# Patient Record
Sex: Male | Born: 1975 | Race: Black or African American | Hispanic: No | Smoking: Never smoker
Health system: Southern US, Community
[De-identification: ages and names within clinical notes are randomized; demographics above are authoritative.]

## PROBLEM LIST (undated history)

## (undated) DIAGNOSIS — F419 Anxiety disorder, unspecified: Secondary | ICD-10-CM

## (undated) DIAGNOSIS — S069XAA Unspecified intracranial injury with loss of consciousness status unknown, initial encounter: Secondary | ICD-10-CM

## (undated) DIAGNOSIS — F32A Depression, unspecified: Secondary | ICD-10-CM

## (undated) DIAGNOSIS — E119 Type 2 diabetes mellitus without complications: Secondary | ICD-10-CM

## (undated) HISTORY — DX: Unspecified intracranial injury with loss of consciousness status unknown, initial encounter: S06.9XAA

## (undated) HISTORY — PX: SHOULDER ARTHROSCOPY: SHX128

## (undated) HISTORY — DX: Depression, unspecified: F32.A

## (undated) HISTORY — DX: Anxiety disorder, unspecified: F41.9

## (undated) HISTORY — PX: APPENDECTOMY: SHX54

## (undated) HISTORY — DX: Type 2 diabetes mellitus without complications: E11.9

## (undated) HISTORY — PX: MENISCUS REPAIR: SHX5179

---

## 2015-12-03 DIAGNOSIS — S069XAA Unspecified intracranial injury with loss of consciousness status unknown, initial encounter: Secondary | ICD-10-CM | POA: Insufficient documentation

## 2016-08-15 DIAGNOSIS — F4321 Adjustment disorder with depressed mood: Secondary | ICD-10-CM | POA: Insufficient documentation

## 2018-05-17 DIAGNOSIS — R739 Hyperglycemia, unspecified: Secondary | ICD-10-CM | POA: Insufficient documentation

## 2019-08-20 DIAGNOSIS — F339 Major depressive disorder, recurrent, unspecified: Secondary | ICD-10-CM | POA: Insufficient documentation

## 2020-06-05 DIAGNOSIS — G8929 Other chronic pain: Secondary | ICD-10-CM | POA: Insufficient documentation

## 2021-05-28 ENCOUNTER — Encounter (HOSPITAL_BASED_OUTPATIENT_CLINIC_OR_DEPARTMENT_OTHER): Payer: Self-pay | Admitting: Emergency Medicine

## 2021-05-28 ENCOUNTER — Emergency Department (HOSPITAL_BASED_OUTPATIENT_CLINIC_OR_DEPARTMENT_OTHER)
Admission: EM | Admit: 2021-05-28 | Discharge: 2021-05-28 | Disposition: A | Discharge status: Home / Self Care | Payer: Self-pay | Attending: Emergency Medicine | Admitting: Emergency Medicine

## 2021-05-28 ENCOUNTER — Other Ambulatory Visit: Payer: Self-pay

## 2021-05-28 ENCOUNTER — Emergency Department (HOSPITAL_BASED_OUTPATIENT_CLINIC_OR_DEPARTMENT_OTHER): Payer: Self-pay

## 2021-05-28 DIAGNOSIS — M545 Low back pain, unspecified: Secondary | ICD-10-CM | POA: Insufficient documentation

## 2021-05-28 DIAGNOSIS — S43402A Unspecified sprain of left shoulder joint, initial encounter: Secondary | ICD-10-CM

## 2021-05-28 DIAGNOSIS — S39012A Strain of muscle, fascia and tendon of lower back, initial encounter: Secondary | ICD-10-CM

## 2021-05-28 DIAGNOSIS — R030 Elevated blood-pressure reading, without diagnosis of hypertension: Secondary | ICD-10-CM

## 2021-05-28 MED ORDER — IBUPROFEN 400 MG PO TABS
400.0000 mg | ORAL_TABLET | Freq: Once | ORAL | Status: AC
  Administered 2021-05-28: 400 mg via ORAL
  Filled 2021-05-28: qty 1

## 2021-05-28 MED ORDER — METHOCARBAMOL 750 MG PO TABS
750.0000 mg | ORAL_TABLET | Freq: Three times a day (TID) | ORAL | 0 refills | Status: DC | PRN
Start: 2021-05-28 — End: 2021-11-08

## 2021-05-28 NOTE — ED Notes (Signed)
ED Provider at bedside. 
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
## 2021-05-28 NOTE — Discharge Instructions (Addendum)
It was our pleasure to provide your ER care today - we hope that you feel better. ? ?Take acetaminophen or ibuprofen as need. You may also take robaxin as need for muscle pain/spasm - no driving when taking. ? ?Follow up with orthopedist in two weeks if symptoms fail to improve/resolve - call office to arrange appointment.  Your xrays were read as showing: Discontinuity of the left acromion. Os acromiale (normal variant) favored over recent acromion fracture, but query point tenderness - discuss with doctor at follow up.  ? ?Your blood pressure is high today - follow up with primary care doctor for recheck in 1-2 weeks.  ? ?Return to ER if worse, new symptoms, new/severe pain, or other concern.  ?

## 2021-05-28 NOTE — ED Notes (Signed)
Discharge instructions discussed with pt. Pt verbalized understanding. Pt stable and ambulatory.  °

## 2021-05-28 NOTE — ED Notes (Signed)
Pt left before vital could be reevaluated. Pt upset that CT was not done on shoulder ?

## 2021-05-28 NOTE — ED Notes (Signed)
Patient transported to Radiology 
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
## 2021-07-19 LAB — HEPATIC FUNCTION PANEL
ALT: 25 U/L (ref 10–40)
AST: 19 (ref 14–40)
Alkaline Phosphatase: 83 (ref 25–125)
Bilirubin, Total: 0.8

## 2021-07-19 LAB — VITAMIN D 25 HYDROXY (VIT D DEFICIENCY, FRACTURES): Vit D, 25-Hydroxy: 9.03

## 2021-07-19 LAB — PTH, INTACT: PTH, Intact: 66.6

## 2021-07-19 LAB — BASIC METABOLIC PANEL
BUN: 19 (ref 4–21)
CO2: 24 — AB (ref 13–22)
Chloride: 104 (ref 99–108)
Creatinine: 1.1 (ref 0.6–1.3)
Glucose: 208
Potassium: 4.5 mEq/L (ref 3.5–5.1)
Sodium: 140 (ref 137–147)

## 2021-07-19 LAB — CBC AND DIFFERENTIAL
HCT: 44 (ref 41–53)
Hemoglobin: 14.5 (ref 13.5–17.5)
Platelets: 151 10*3/uL (ref 150–400)
WBC: 6.7

## 2021-07-19 LAB — CBC: RBC: 4.9 (ref 3.87–5.11)

## 2021-07-19 LAB — LIPID PANEL
Cholesterol: 200 (ref 0–200)
HDL: 48 (ref 35–70)
LDL Cholesterol: 125
Triglycerides: 137 (ref 40–160)

## 2021-07-19 LAB — COMPREHENSIVE METABOLIC PANEL
Albumin: 4.3 (ref 3.5–5.0)
Calcium: 9.3 (ref 8.7–10.7)
Globulin: 2.3

## 2021-07-19 LAB — HM HIV SCREENING LAB: HM HIV Screening: NEGATIVE

## 2021-07-19 LAB — HEMOGLOBIN A1C: Hemoglobin A1C: 7.6

## 2021-11-08 ENCOUNTER — Ambulatory Visit (INDEPENDENT_AMBULATORY_CARE_PROVIDER_SITE_OTHER): Payer: Self-pay | Admitting: Nurse Practitioner

## 2021-11-08 ENCOUNTER — Encounter: Payer: Self-pay | Admitting: Nurse Practitioner

## 2021-11-08 VITALS — BP 130/92 | HR 96 | Temp 97.6°F | Resp 12 | Ht 74.0 in | Wt 220.1 lb

## 2021-11-08 DIAGNOSIS — M25562 Pain in left knee: Secondary | ICD-10-CM

## 2021-11-08 DIAGNOSIS — R030 Elevated blood-pressure reading, without diagnosis of hypertension: Secondary | ICD-10-CM

## 2021-11-08 DIAGNOSIS — E1169 Type 2 diabetes mellitus with other specified complication: Secondary | ICD-10-CM

## 2021-11-08 DIAGNOSIS — E785 Hyperlipidemia, unspecified: Secondary | ICD-10-CM

## 2021-11-08 DIAGNOSIS — G8929 Other chronic pain: Secondary | ICD-10-CM

## 2021-11-08 DIAGNOSIS — S069X9S Unspecified intracranial injury with loss of consciousness of unspecified duration, sequela: Secondary | ICD-10-CM

## 2021-11-08 DIAGNOSIS — E1165 Type 2 diabetes mellitus with hyperglycemia: Secondary | ICD-10-CM

## 2021-11-08 DIAGNOSIS — N50811 Right testicular pain: Secondary | ICD-10-CM

## 2021-11-08 DIAGNOSIS — N50812 Left testicular pain: Secondary | ICD-10-CM

## 2021-11-08 LAB — COMPREHENSIVE METABOLIC PANEL
ALT: 24 U/L (ref 0–53)
AST: 18 U/L (ref 0–37)
Albumin: 4.3 g/dL (ref 3.5–5.2)
Alkaline Phosphatase: 63 U/L (ref 39–117)
BUN: 12 mg/dL (ref 6–23)
CO2: 27 mEq/L (ref 19–32)
Calcium: 9.2 mg/dL (ref 8.4–10.5)
Chloride: 105 mEq/L (ref 96–112)
Creatinine, Ser: 1 mg/dL (ref 0.40–1.50)
GFR: 91.74 mL/min (ref >60.00)
Glucose, Bld: 174 mg/dL — ABNORMAL HIGH (ref 70–99)
Potassium: 3.9 mEq/L (ref 3.5–5.1)
Sodium: 139 mEq/L (ref 135–145)
Total Bilirubin: 0.9 mg/dL (ref 0.2–1.2)
Total Protein: 6.7 g/dL (ref 6.0–8.3)

## 2021-11-08 LAB — CBC
HCT: 41.7 % (ref 39.0–52.0)
Hemoglobin: 13.9 g/dL (ref 13.0–17.0)
MCHC: 33.2 g/dL (ref 30.0–36.0)
MCV: 91 fl (ref 78.0–100.0)
Platelets: 179 10*3/uL (ref 150.0–400.0)
RBC: 4.58 Mil/uL (ref 4.22–5.81)
RDW: 13.7 % (ref 11.5–15.5)
WBC: 5.4 10*3/uL (ref 4.0–10.5)

## 2021-11-08 LAB — MICROALBUMIN / CREATININE URINE RATIO
Creatinine,U: 71.9 mg/dL
Microalb Creat Ratio: 1.6 mg/g (ref 0.0–30.0)
Microalb, Ur: 1.2 mg/dL (ref 0.0–1.9)

## 2021-11-08 LAB — LIPID PANEL
Cholesterol: 184 mg/dL (ref 0–200)
HDL: 44.3 mg/dL (ref >39.00)
LDL Cholesterol: 128 mg/dL — ABNORMAL HIGH (ref 0–99)
NonHDL: 139.5
Total CHOL/HDL Ratio: 4
Triglycerides: 59 mg/dL (ref 0.0–149.0)
VLDL: 11.8 mg/dL (ref 0.0–40.0)

## 2021-11-08 LAB — HEMOGLOBIN A1C: Hgb A1c MFr Bld: 7.3 % — ABNORMAL HIGH (ref 4.6–6.5)

## 2021-11-08 NOTE — Assessment & Plan Note (Signed)
Patient's last A1c was 7.6 in office.  Patient currently on metformin and Mounjaro 2.5 mg.  Tolerating medications well per patient report does check his sugar several times a day up to 3-4 times.
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
## 2021-11-08 NOTE — Assessment & Plan Note (Signed)
Last LDL was 125 pending being scanned to the chart.  Patient is fasting this morning we will check cholesterol given setting of diabetes.  Patient is not  obese

## 2021-11-08 NOTE — Patient Instructions (Addendum)
Nice to see you today I will be in touch with the labs once I have them I want to see you in 4 month for a diabetes follow up, sooner if you need me    Let me know about if you want to get the ultrasound done

## 2021-11-08 NOTE — Assessment & Plan Note (Signed)
Elevated in office slightly still elevated slightly on recheck.  If continue to be elevated consider placing patient on ARB due to diabetes diagnosis
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
## 2021-11-08 NOTE — Assessment & Plan Note (Signed)
Has been going on for approximately 6 months or more.  Intermittent in nature exam grossly normal in office today.  Did tell patient to contact ultrasound at this persists.  Patient will think on this and get back to me whether he like to pursue ultrasound of testicles

## 2021-11-08 NOTE — Assessment & Plan Note (Signed)
Has chronic knee pain along with shoulder pain with repair patient needs a rotator cuff repair currently.  He is [facility] receiving Percocet 12/29/2023 6 times a day as needed.  Will refer to pain management

## 2021-11-11 ENCOUNTER — Telehealth: Payer: Self-pay | Admitting: Nurse Practitioner

## 2021-11-11 DIAGNOSIS — E1165 Type 2 diabetes mellitus with hyperglycemia: Secondary | ICD-10-CM

## 2021-11-11 MED ORDER — ROSUVASTATIN CALCIUM 5 MG PO TABS: 5.0000 mg | ORAL_TABLET | Freq: Every day | ORAL | 3 refills | Status: DC

## 2021-11-11 NOTE — Telephone Encounter (Signed)
-----   Message from [facility], [place] sent at 11/10/2021  2:36 PM EDT ----- Patient advised. Patient agreeable to taking medication. Fasting lab appt set up already for 02/08/22

## 2021-11-11 NOTE — Telephone Encounter (Signed)
Medication sent in. 

## 2021-11-15 ENCOUNTER — Encounter: Payer: Self-pay | Admitting: *Deleted

## 2021-11-22 ENCOUNTER — Encounter: Payer: Self-pay | Admitting: Nurse Practitioner

## 2021-12-07 ENCOUNTER — Telehealth: Payer: Self-pay | Admitting: Nurse Practitioner

## 2021-12-07 NOTE — Telephone Encounter (Signed)
I sent patient mychart to follow up on this, patient was given information on the pain management insurance x 2 via mychart and has viewed this. Waiting for response

## 2021-12-07 NOTE — Telephone Encounter (Signed)
They reached out to me about his pain clinic referral can we see where we are and what is happening in that regard and reach out to the patient please  Can we see if they have called the [facility] please

## 2021-12-07 NOTE — Telephone Encounter (Signed)
Spoke with [facility], they did receive referral and it is in review by the provider at this time. Patient advised via mychart message

## 2021-12-13 ENCOUNTER — Telehealth: Payer: Self-pay

## 2021-12-13 DIAGNOSIS — G8929 Other chronic pain: Secondary | ICD-10-CM

## 2021-12-13 NOTE — Telephone Encounter (Signed)
Heard from [place], patient's girlfriend, [facility] office does not see patients for opioids treatment but they found another office and spoke with them and were told they could see the patient is we send over the referral. Dr Rayvon Char. [place] in [place] Fax: [phone].  Referral placed and pended for review/signature. Lovena Le is aware Catalina Antigua is not in the office but since they have been dealing with this for a while will have another provider review.

## 2021-12-14 NOTE — Telephone Encounter (Signed)
Completed.

## 2021-12-14 NOTE — Telephone Encounter (Signed)
Shane Wilcox advised on voicemail that referral has been sent over

## 2021-12-17 ENCOUNTER — Telehealth: Payer: Self-pay | Admitting: Nurse Practitioner

## 2021-12-17 DIAGNOSIS — G8929 Other chronic pain: Secondary | ICD-10-CM

## 2021-12-17 NOTE — Telephone Encounter (Signed)
Patient spouse Shane Wilcox called in and was wanting a pain management referral to be sent over to [facility]. Fax number is [phone]. She stated she will like a call when referral is put in. Thank you!

## 2021-12-20 ENCOUNTER — Other Ambulatory Visit: Payer: Self-pay | Admitting: Nurse Practitioner

## 2021-12-20 MED ORDER — OXYCODONE-ACETAMINOPHEN 10-325 MG PO TABS: 1.0000 | ORAL_TABLET | Freq: Three times a day (TID) | ORAL | 0 refills | Status: DC | PRN

## 2021-12-20 NOTE — Telephone Encounter (Signed)
Please review. Patient is out of the medication for 4 days now. Patient is still trying to get in with pain management. [facility] said they do not do opiod management [facility] said they only manage Percocet at 4 tablets daily max and he is on 6.  Can they get refills to last him until he can get in with someone. They are trying still to get in.  New referral placed for [facility] today. Waiting for that to be reviewed.

## 2021-12-20 NOTE — Telephone Encounter (Signed)
Patient advised.

## 2021-12-20 NOTE — Telephone Encounter (Signed)
Patient is ok with 10-325 mg 3 tablets a day

## 2021-12-20 NOTE — Telephone Encounter (Signed)
  Encourage patient to contact the pharmacy for refills or they can request refills through [facility]  Did the patient contact the pharmacy: no    LAST APPOINTMENT DATE:  Please schedule appointment if longer than 1 year  NEXT APPOINTMENT DATE:02/08/2022  MEDICATION:oxyCODONE-acetaminophen (PERCOCET) 10-325 MG tablet  Is the patient out of medication? yes  If not, how much is left?  Is this a 90 day supply: no  PHARMACY: [facility] [address] - [place], [place] - [address] [place] [address] [place] Phone:  [phone]  Fax:  [phone]      Let patient know to contact pharmacy at the end of the day to make sure medication is ready.  Please notify patient to allow 48-72 hours to process  CLINICAL FILLS OUT ALL BELOW:

## 2021-12-20 NOTE — Telephone Encounter (Signed)
Referral placed.

## 2021-12-27 ENCOUNTER — Other Ambulatory Visit: Payer: Self-pay | Admitting: Nurse Practitioner

## 2021-12-27 MED ORDER — OXYCODONE-ACETAMINOPHEN 10-325 MG PO TABS: 1.0000 | ORAL_TABLET | Freq: Three times a day (TID) | ORAL | 0 refills | Status: AC | PRN

## 2021-12-27 NOTE — Telephone Encounter (Signed)
  Encourage patient to contact the pharmacy for refills or they can request refills through [facility]  Did the patient contact the pharmacy: No  LAST APPOINTMENT DATE: 11/08/2021  NEXT APPOINTMENT DATE:  N/A  MEDICATION: oxyCODONE-acetaminophen (PERCOCET) 10-325 MG tablet  Is the patient out of medication? Yes  PHARMACY: [facility] [address] - [place], [place] - [address] [place] [address]  Comment:  Patient still hasn't received no call from referral.   Let patient know to contact pharmacy at the end of the day to make sure medication is ready.  Please notify patient to allow 48-72 hours to process

## 2022-02-08 ENCOUNTER — Ambulatory Visit: Payer: Medicaid Other | Admitting: Nurse Practitioner

## 2022-02-08 ENCOUNTER — Other Ambulatory Visit: Payer: Self-pay

## 2022-05-18 ENCOUNTER — Encounter: Payer: Self-pay | Admitting: Nurse Practitioner

## 2022-05-18 ENCOUNTER — Ambulatory Visit: Payer: Medicaid Other | Admitting: Nurse Practitioner

## 2022-09-26 ENCOUNTER — Ambulatory Visit: Payer: Medicaid Other | Admitting: Nurse Practitioner

## 2022-10-27 ENCOUNTER — Ambulatory Visit: Payer: Medicaid Other | Admitting: Nurse Practitioner

## 2022-11-16 ENCOUNTER — Ambulatory Visit (INDEPENDENT_AMBULATORY_CARE_PROVIDER_SITE_OTHER): Payer: Medicaid Other | Admitting: Nurse Practitioner

## 2022-11-16 ENCOUNTER — Encounter: Payer: Self-pay | Admitting: *Deleted

## 2022-11-16 ENCOUNTER — Encounter: Payer: Self-pay | Admitting: Nurse Practitioner

## 2022-11-16 VITALS — BP 128/88 | HR 102 | Temp 98.1°F | Ht 74.0 in | Wt 212.0 lb

## 2022-11-16 DIAGNOSIS — E785 Hyperlipidemia, unspecified: Secondary | ICD-10-CM

## 2022-11-16 DIAGNOSIS — E1165 Type 2 diabetes mellitus with hyperglycemia: Secondary | ICD-10-CM

## 2022-11-16 DIAGNOSIS — M25562 Pain in left knee: Secondary | ICD-10-CM

## 2022-11-16 DIAGNOSIS — Z7985 Long-term (current) use of injectable non-insulin antidiabetic drugs: Secondary | ICD-10-CM

## 2022-11-16 DIAGNOSIS — N529 Male erectile dysfunction, unspecified: Secondary | ICD-10-CM | POA: Diagnosis not present

## 2022-11-16 DIAGNOSIS — E1169 Type 2 diabetes mellitus with other specified complication: Secondary | ICD-10-CM

## 2022-11-16 DIAGNOSIS — G8929 Other chronic pain: Secondary | ICD-10-CM

## 2022-11-16 LAB — COMPREHENSIVE METABOLIC PANEL
ALT: 17 U/L (ref 0–53)
AST: 17 U/L (ref 0–37)
Albumin: 4.4 g/dL (ref 3.5–5.2)
Alkaline Phosphatase: 79 U/L (ref 39–117)
BUN: 17 mg/dL (ref 6–23)
CO2: 26 meq/L (ref 19–32)
Calcium: 9.5 mg/dL (ref 8.4–10.5)
Chloride: 106 meq/L (ref 96–112)
Creatinine, Ser: 1.05 mg/dL (ref 0.40–1.50)
GFR: 85.91 mL/min (ref >60.00)
Glucose, Bld: 156 mg/dL — ABNORMAL HIGH (ref 70–99)
Potassium: 4.5 meq/L (ref 3.5–5.1)
Sodium: 139 meq/L (ref 135–145)
Total Bilirubin: 0.9 mg/dL (ref 0.2–1.2)
Total Protein: 6.7 g/dL (ref 6.0–8.3)

## 2022-11-16 LAB — POCT GLYCOSYLATED HEMOGLOBIN (HGB A1C): Hemoglobin A1C: 8.6 % — AB (ref 4.0–5.6)

## 2022-11-16 LAB — LIPID PANEL
Cholesterol: 194 mg/dL (ref 0–200)
HDL: 47.5 mg/dL (ref >39.00)
LDL Cholesterol: 129 mg/dL — ABNORMAL HIGH (ref 0–99)
NonHDL: 146.79
Total CHOL/HDL Ratio: 4
Triglycerides: 88 mg/dL (ref 0.0–149.0)
VLDL: 17.6 mg/dL (ref 0.0–40.0)

## 2022-11-16 LAB — MICROALBUMIN / CREATININE URINE RATIO
Creatinine,U: 155.5 mg/dL
Microalb Creat Ratio: 0.5 mg/g (ref 0.0–30.0)
Microalb, Ur: 0.9 mg/dL (ref 0.0–1.9)

## 2022-11-16 LAB — CBC
HCT: 42.5 % (ref 39.0–52.0)
Hemoglobin: 14 g/dL (ref 13.0–17.0)
MCHC: 33 g/dL (ref 30.0–36.0)
MCV: 90.5 fl (ref 78.0–100.0)
Platelets: 193 10*3/uL (ref 150.0–400.0)
RBC: 4.7 Mil/uL (ref 4.22–5.81)
RDW: 13.9 % (ref 11.5–15.5)
WBC: 6.6 10*3/uL (ref 4.0–10.5)

## 2022-11-16 MED ORDER — TADALAFIL 10 MG PO TABS
10.0000 mg | ORAL_TABLET | ORAL | 1 refills | Status: DC | PRN
Start: 2022-11-16 — End: 2022-12-19

## 2022-11-16 MED ORDER — JANUMET XR 50-1000 MG PO TB24
1.0000 | ORAL_TABLET | Freq: Every day | ORAL | 0 refills | Status: DC
Start: 2022-11-16 — End: 2023-02-16

## 2022-11-16 NOTE — Progress Notes (Signed)
Established Patient Office Visit  Subjective   Patient ID: Shane Wilcox, male    DOB: 05-01-1975  Age: 47 y.o. MRN: [id number]  Chief Complaint  Patient presents with   Referral    Pt complains of need for pain referral to [facility] pain. Pt states left hip to upper leg is causing severe pain.    Medication Reaction    Pt states he would like to take janumet 50mg  OTC for diabetes instead of metformin    HPI 8.6 DM2: Patient was supposed to be on metformin and Mounjaro.  Have not seen patient for approximately 1 year. States that he tried the janumet from his mother. 50/1000. Checking sugars: states that he is checking it twice a day. 124 this morning 158 last night  Hyperglycemia: Hypoglycemia: None States that the plain metformin cause stomach upset and diarrhea. He was taking the mounjaro and the januet from his mother   HLD: Patient was on Crestor 5 mg for HLD and risk reduction in setting of diabetes.  Patient did not have follow-up labs after starting medication done. Has not been taking  HTN: states that he is checking it 2-4 times a month and has been normal    Chronic pain: patient was seen at [facility] in the past and was referred out he is currenty be ing seen by Tonye Royalty, MD in [place]  States that he is having trouble getting an erection. States that he can get them sometimes and he is able to [facility] and have sex. Has tried vigara in the past. States he has bad heart burn with the medication    Review of Systems  Constitutional:  Negative for chills and fever.  Respiratory:  Negative for shortness of breath.   Cardiovascular:  Negative for chest pain.  Gastrointestinal:  Negative for abdominal pain, constipation, diarrhea, nausea and vomiting.  Neurological:  Negative for headaches.  Psychiatric/Behavioral:  Negative for hallucinations and suicidal ideas.       Objective:     BP 128/88   Pulse (!) 102   Temp 98.1 F (36.7 C)  (Temporal)   Ht 6\' 2"  (1.88 m)   Wt 212 lb (96.2 kg)   SpO2 94%   BMI 27.22 kg/m  BP Readings from Last 3 Encounters:  11/16/22 128/88  11/08/21 (!) 130/92  05/28/21 (!) 145/107   Wt Readings from Last 3 Encounters:  11/16/22 212 lb (96.2 kg)  11/08/21 220 lb 2 oz (99.8 kg)  05/28/21 215 lb (97.5 kg)   SpO2 Readings from Last 3 Encounters:  11/16/22 94%  11/08/21 97%  05/28/21 96%      Physical Exam Vitals and nursing note reviewed.  Constitutional:      Appearance: Normal appearance.  Cardiovascular:     Rate and Rhythm: Normal rate and regular rhythm.     Pulses:          Dorsalis pedis pulses are 2+ on the right side and 2+ on the left side.       Posterior tibial pulses are 2+ on the right side and 2+ on the left side.     Heart sounds: Normal heart sounds.  Pulmonary:     Effort: Pulmonary effort is normal.     Breath sounds: Normal breath sounds.  Musculoskeletal:     Right lower leg: No edema.     Left lower leg: No edema.  Feet:     Right foot:     Skin integrity: Dry skin present.  Left foot:     Skin integrity: Dry skin present.  Neurological:     Mental Status: He is alert.      Results for orders placed or performed in visit on 11/16/22  POCT glycosylated hemoglobin (Hb A1C)  Result Value Ref Range   Hemoglobin A1C 8.6 (A) 4.0 - 5.6 %   HbA1c POC (<> result, manual entry)     HbA1c, POC (prediabetic range)     HbA1c, POC (controlled diabetic range)        The 10-year ASCVD risk score (Arnett DK, et al., 2019) is: 7.8%    Assessment & Plan:   Problem List Items Addressed This Visit       Endocrine   Hyperlipidemia associated with type 2 diabetes mellitus (HCC)    Has not been taking the cholesterol medication.  Pending lipid panel today      Relevant Medications   lisinopril (ZESTRIL) 20 MG tablet   tadalafil (CIALIS) 10 MG tablet   SitaGLIPtin-MetFORMIN HCl (JANUMET XR) 50-1000 MG TB24   Other Relevant Orders   CBC    Comprehensive metabolic panel   Lipid panel   TSH   Type 2 diabetes mellitus with hyperglycemia, without long-term current use of insulin (HCC) - Primary    Patient is been taking Janumet and tirzepatide at the same time.  He is having GI upset with regular metformin.  Will start patient on Janumet 50-1000 mg XR once daily.  Patient to discontinue tirzepatide as he will be on a DPP 4.  Continue checking glucoses at home follow-up 3 months      Relevant Medications   lisinopril (ZESTRIL) 20 MG tablet   SitaGLIPtin-MetFORMIN HCl (JANUMET XR) 50-1000 MG TB24   Other Relevant Orders   POCT glycosylated hemoglobin (Hb A1C) (Completed)   CBC   Comprehensive metabolic panel   Lipid panel   TSH   Microalbumin / creatinine urine ratio     Other   Chronic pain of left knee    Patient is followed by pain clinic in [place].  Patient is been talking with his therapist and he felt like [facility] would be a better fit for him patient asked for referral referral placed today      Relevant Medications   oxyCODONE-acetaminophen (PERCOCET) 10-325 MG tablet   Other Relevant Orders   Ambulatory referral to Pain Clinic   Erectile dysfunction    Patient is trazodone for the past gave him incredible heartburn.  Will try tadalafil 10 mg as needed sexual intercourse      Relevant Medications   tadalafil (CIALIS) 10 MG tablet   Other Relevant Orders   TSH    Return in about 3 months (around 02/16/2023) for DM recheck.    Audria Nine, NP

## 2022-11-16 NOTE — Assessment & Plan Note (Signed)
Patient is been taking Janumet and tirzepatide at the same time.  He is having GI upset with regular metformin.  Will start patient on Janumet 50-1000 mg XR once daily.  Patient to discontinue tirzepatide as he will be on a DPP 4.  Continue checking glucoses at home follow-up 3 months

## 2022-11-16 NOTE — Assessment & Plan Note (Signed)
Has not been taking the cholesterol medication.  Pending lipid panel today

## 2022-11-16 NOTE — Patient Instructions (Signed)
Nice to see you today I have made the referral as requested I have sent in the Janumet. Take no other medications for your diabetes that is not prescribed to you Follow up with me in 3 months, sooner if you need me

## 2022-11-16 NOTE — Assessment & Plan Note (Signed)
Patient is followed by pain clinic in [place].  Patient is been talking with his therapist and he felt like [facility] would be a better fit for him patient asked for referral referral placed today

## 2022-11-16 NOTE — Assessment & Plan Note (Signed)
Patient is trazodone for the past gave him incredible heartburn.  Will try tadalafil 10 mg as needed sexual intercourse

## 2022-11-18 ENCOUNTER — Encounter: Payer: Self-pay | Admitting: Nurse Practitioner

## 2022-11-18 LAB — TSH: TSH: 1.73 u[IU]/mL (ref 0.35–5.50)

## 2022-11-21 ENCOUNTER — Encounter: Payer: Self-pay | Admitting: Nurse Practitioner

## 2022-11-21 ENCOUNTER — Telehealth: Payer: Self-pay | Admitting: Nurse Practitioner

## 2022-11-21 DIAGNOSIS — E1165 Type 2 diabetes mellitus with hyperglycemia: Secondary | ICD-10-CM

## 2022-11-21 MED ORDER — ROSUVASTATIN CALCIUM 5 MG PO TABS
5.0000 mg | ORAL_TABLET | Freq: Every day | ORAL | 0 refills | Status: DC
Start: 2022-11-21 — End: 2023-02-16

## 2022-11-21 NOTE — Telephone Encounter (Signed)
-----   Message from [facility] sent at 11/21/2022  1:03 PM EDT ----- Added from my chart message from patient.   Yes sir if you send it in I will take it if it helps me to prevent strokes or heart attacks

## 2022-11-22 NOTE — Telephone Encounter (Signed)
Please submit PA for Janumet XR

## 2022-11-24 ENCOUNTER — Telehealth: Payer: Self-pay

## 2022-11-24 NOTE — Telephone Encounter (Signed)
Pharmacy Patient Advocate Encounter   Received notification from Patient Advice Request messages that prior authorization for Janumet XR 50-1000MG  er tablets is required/requested.   Insurance verification completed.   The patient is insured through [facility] [place] .   Per test claim: PA required; PA submitted to [facility] Medicaid via CoverMyMeds Key/confirmation #/EOC [id number] Status is pending

## 2022-11-25 ENCOUNTER — Other Ambulatory Visit (HOSPITAL_COMMUNITY): Payer: Self-pay

## 2022-11-25 DIAGNOSIS — N529 Male erectile dysfunction, unspecified: Secondary | ICD-10-CM

## 2022-11-25 NOTE — Telephone Encounter (Signed)
Pharmacy Patient Advocate Encounter  Received notification from [facility] Medicaid that Prior Authorization for Janumet XR 50-1000MG  er tablets has been APPROVED from 11/24/22 to 11/24/23   PA #/Case ID/Reference #: [id number]

## 2022-12-16 NOTE — Telephone Encounter (Signed)
Message sent to patient to let know provider is out of the office and will address upon return.

## 2022-12-19 MED ORDER — TADALAFIL 10 MG PO TABS
10.0000 mg | ORAL_TABLET | ORAL | 1 refills | Status: DC | PRN
Start: 2022-12-19 — End: 2023-02-16

## 2022-12-19 NOTE — Addendum Note (Signed)
Addended by: Eden Emms on: 12/19/2022 01:46 PM   Modules accepted: Orders

## 2022-12-19 NOTE — Telephone Encounter (Signed)
Refill sent in

## 2023-02-16 ENCOUNTER — Ambulatory Visit: Payer: Medicaid Other | Admitting: Nurse Practitioner

## 2023-02-16 ENCOUNTER — Encounter: Payer: Self-pay | Admitting: Nurse Practitioner

## 2023-02-16 VITALS — BP 120/88 | HR 96 | Temp 97.9°F | Ht 74.0 in | Wt 208.0 lb

## 2023-02-16 DIAGNOSIS — Z7984 Long term (current) use of oral hypoglycemic drugs: Secondary | ICD-10-CM | POA: Diagnosis not present

## 2023-02-16 DIAGNOSIS — E785 Hyperlipidemia, unspecified: Secondary | ICD-10-CM

## 2023-02-16 DIAGNOSIS — N529 Male erectile dysfunction, unspecified: Secondary | ICD-10-CM | POA: Diagnosis not present

## 2023-02-16 DIAGNOSIS — E1169 Type 2 diabetes mellitus with other specified complication: Secondary | ICD-10-CM

## 2023-02-16 DIAGNOSIS — E1165 Type 2 diabetes mellitus with hyperglycemia: Secondary | ICD-10-CM

## 2023-02-16 LAB — POCT GLYCOSYLATED HEMOGLOBIN (HGB A1C): Hemoglobin A1C: 10.8 % — AB (ref 4.0–5.6)

## 2023-02-16 MED ORDER — TADALAFIL 10 MG PO TABS
10.0000 mg | ORAL_TABLET | ORAL | 1 refills | Status: AC | PRN
Start: 2023-02-16 — End: ?

## 2023-02-16 MED ORDER — LISINOPRIL 2.5 MG PO TABS: 2.5000 mg | ORAL_TABLET | Freq: Every day | ORAL | 2 refills | Status: AC

## 2023-02-16 MED ORDER — ROSUVASTATIN CALCIUM 5 MG PO TABS: 5.0000 mg | ORAL_TABLET | Freq: Every day | ORAL | 2 refills | Status: AC

## 2023-02-16 MED ORDER — JANUMET XR 50-1000 MG PO TB24: 2.0000 | ORAL_TABLET | Freq: Every day | ORAL | 1 refills | Status: AC

## 2023-02-16 MED ORDER — GLIPIZIDE ER 5 MG PO TB24: 5.0000 mg | ORAL_TABLET | Freq: Every day | ORAL | 1 refills | Status: AC

## 2023-02-16 NOTE — Patient Instructions (Signed)
Nice to see you today Follow up with me in 3 months, sooner if you need me

## 2023-02-16 NOTE — Progress Notes (Signed)
Established Patient Office Visit  Subjective   Patient ID: Shane Wilcox, male    DOB: March 10, 1976  Age: 47 y.o. MRN: [id number]  Chief Complaint  Patient presents with   Diabetes    Pt states that he has been doing good.    HPI  DM2: Patient currently maintained on Janumet 50-1000 XR once daily. 210 last night after dinner and 160 this morning. 2 tablets once a day.   HLD: Patient has a history of same and had been taking his cholesterol medication.  We did check lipids cholesterol was elevated patient was to start back on Crestor 5 mg.  HTN: Patient currently maintained on lisinopril 20 mg daily.  As of last report he was not taking. States that he was placed on this when he was incarcerated. Patient has not been on anti hypertensive's since.     Review of Systems  Constitutional:  Negative for chills and fever.  Respiratory:  Negative for shortness of breath.   Cardiovascular:  Negative for chest pain.  Neurological:  Negative for tingling and headaches.  Psychiatric/Behavioral:  Negative for hallucinations and suicidal ideas.       Objective:     BP 120/88   Pulse 96   Temp 97.9 F (36.6 C) (Oral)   Ht 6\' 2"  (1.88 m)   Wt 208 lb (94.3 kg)   SpO2 96%   BMI 26.71 kg/m    Physical Exam Vitals and nursing note reviewed.  Constitutional:      Appearance: Normal appearance.  Cardiovascular:     Rate and Rhythm: Normal rate and regular rhythm.     Heart sounds: Normal heart sounds.  Pulmonary:     Effort: Pulmonary effort is normal.     Breath sounds: Normal breath sounds.  Neurological:     Mental Status: He is alert.      Results for orders placed or performed in visit on 02/16/23  POCT glycosylated hemoglobin (Hb A1C)  Result Value Ref Range   Hemoglobin A1C 10.8 (A) 4.0 - 5.6 %   HbA1c POC (<> result, manual entry)     HbA1c, POC (prediabetic range)     HbA1c, POC (controlled diabetic range)        The 10-year ASCVD risk score (Arnett  DK, et al., 2019) is: 6.9%    Assessment & Plan:   Problem List Items Addressed This Visit       Endocrine   Hyperlipidemia associated with type 2 diabetes mellitus (HCC)    Patient currently maintained on rosuvastatin 5 mg daily.  Continue      Relevant Medications   SitaGLIPtin-MetFORMIN HCl (JANUMET XR) 50-1000 MG TB24   tadalafil (CIALIS) 10 MG tablet   lisinopril (ZESTRIL) 2.5 MG tablet   rosuvastatin (CRESTOR) 5 MG tablet   glipiZIDE (GLUCOTROL XL) 5 MG 24 hr tablet   Type 2 diabetes mellitus with hyperglycemia, without long-term current use of insulin (HCC) - Primary    Patient was maintained on Janumet 50-1000 mg 2 tablets daily.  Patient is A1c actually went up to 10.8% today we will add on glipizide 5 mg XL.  Was going out on a GLP-1 but he is on a DPP 4.  Continue checking glucose daily.  History of blood pressure problems was on lisinopril has not been on it for extended period of time we will put patient on lisinopril 2.5 mg daily for renal  protection in setting of diabetes      Relevant Medications  SitaGLIPtin-MetFORMIN HCl (JANUMET XR) 50-1000 MG TB24   lisinopril (ZESTRIL) 2.5 MG tablet   rosuvastatin (CRESTOR) 5 MG tablet   glipiZIDE (GLUCOTROL XL) 5 MG 24 hr tablet   Other Relevant Orders   POCT glycosylated hemoglobin (Hb A1C) (Completed)     Other   Erectile dysfunction    History of the same patient currently maintained on tadalafil refill provided      Relevant Medications   tadalafil (CIALIS) 10 MG tablet   Other Visit Diagnoses     Controlled type 2 diabetes mellitus with hyperglycemia, without long-term current use of insulin (HCC)       Relevant Medications   SitaGLIPtin-MetFORMIN HCl (JANUMET XR) 50-1000 MG TB24   lisinopril (ZESTRIL) 2.5 MG tablet   rosuvastatin (CRESTOR) 5 MG tablet   glipiZIDE (GLUCOTROL XL) 5 MG 24 hr tablet       Return in about 3 months (around 05/19/2023) for DM recheck.    Audria Nine, NP

## 2023-02-16 NOTE — Assessment & Plan Note (Addendum)
Patient was maintained on Janumet 50-1000 mg 2 tablets daily.  Patient is A1c actually went up to 10.8% today we will add on glipizide 5 mg XL.  Was going out on a GLP-1 but he is on a DPP 4.  Continue checking glucose daily.  History of blood pressure problems was on lisinopril has not been on it for extended period of time we will put patient on lisinopril 2.5 mg daily for renal  protection in setting of diabetes

## 2023-02-16 NOTE — Assessment & Plan Note (Signed)
History of the same patient currently maintained on tadalafil refill provided

## 2023-02-16 NOTE — Assessment & Plan Note (Signed)
Patient currently maintained on rosuvastatin 5 mg daily.  Continue

## 2023-05-08 ENCOUNTER — Telehealth: Payer: Self-pay

## 2023-05-08 NOTE — Transitions of Care (Post Inpatient/ED Visit) (Signed)
 Unable to reach patient by phone and left v/m requesting call back at [phone].         05/08/2023  Name: Shane Wilcox MRN: [id number] DOB: 10/18/1975  Today's TOC FU Call Status: Today's TOC FU Call Status:: Unsuccessful Call (1st Attempt) Unsuccessful Call (1st Attempt) Date: 05/08/23  Attempted to reach the patient regarding the most recent Inpatient/ED visit.  Follow Up Plan: Additional outreach attempts will be made to reach the patient to complete the Transitions of Care (Post Inpatient/ED visit) call.   Signature Claretha Crocker, LPN

## 2023-05-08 NOTE — Transitions of Care (Post Inpatient/ED Visit) (Signed)
 Pt returned my call and said seen UC in [facility] on 05/01/23 after fall at [facility] on 04/28/23 after slipping on floor where pt said there was water on floor due to roof leaking and pt fell. 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 Pt went to [facility] ED on 05/04/23 and [facility] due to orthodontist appt. Right now 05/08/23 sharp back pain is radiating down lt leg at pain level of 8. Pt said pain is excrutiating and pt has another call and asked me to return his call at 4:30 and pt ended call. I spoke with Margarie Shay NP and he said pt could FU with pain mgt or go to ortho walk in and if severe pain, pt not able to tolerate weight bearing or incontinence of urine or bowel pt can go to ED.I called pt back and advised of Rosita Cools NP instructions and pt voiced understanding. Pt said he did need pain med because he "messed up his rotator cuff at work again." Pt will call pain mgt and pt will go to ortho UC for FU and pt said he has appt with Matt on 05/29/23 and he will updae Matt at that time. Sending note to Margarie Shay NP.         05/08/2023  Name: Shane Wilcox MRN: [id number] DOB: 11-Jan-1976  Today's TOC FU Call Status: Today's TOC FU Call Status:: Unsuccessful Call (2nd Attempt) Unsuccessful Call (1st Attempt) Date: 05/08/23 Unsuccessful Call (2nd Attempt) Date: 05/08/23  successful return call on 05/08/23 Patient's Name and Date of Birth confirmed.  Transition Care Management Follow-up Telephone Call    Items Reviewed:    Medications Reviewed Today: Medications Reviewed Today   Medications were not reviewed in this encounter     Home Care and Equipment/Supplies:    Functional Questionnaire:    Follow up appointments reviewed:      SIGNATURE Claretha Crocker, LPN

## 2023-05-09 NOTE — Telephone Encounter (Signed)
noted

## 2023-05-29 ENCOUNTER — Ambulatory Visit: Payer: Medicaid Other | Admitting: Nurse Practitioner

## 2023-07-25 ENCOUNTER — Telehealth: Payer: Self-pay | Admitting: Nurse Practitioner

## 2023-07-25 ENCOUNTER — Ambulatory Visit: Payer: Self-pay | Admitting: *Deleted

## 2023-07-25 ENCOUNTER — Telehealth: Payer: Self-pay

## 2023-07-25 NOTE — Telephone Encounter (Signed)
 Call Kristopher Pheasant NP [phone] ED in [place]

## 2023-07-25 NOTE — Telephone Encounter (Signed)
 Chief Complaint: anxious, feels like going through withdrawals from percocet / xanax. Pain clinic Dr. Has not refilled Rx and counselor reports they can not get in touch with provider to send refills.  Symptoms: nausea, sweating, anxious. Has been without percocet x 12 days and without xanax.  Frequency: today  Pertinent Negatives: Patient denies chest pain with difficulty breathing.  Disposition: [] ED /[] Urgent Care (no appt availability in office) / [] Appointment(In office/virtual)/ []  [facility]/ [] Home Care/ [x] Refused Recommended Disposition /[] [facility]/ []  Follow-up with PCP Additional Notes:   Patient is in [place] visiting mother in hospital and reports he is having sx of withdrawals and can not get in touch with Cheryln Cory from pain clinic. Has attempted to contact provider since 07/17/23 for refills. NT recommended ED due to sx starting. Patient wanted appt with PCP due to not able to get in touch with pain clinic dr and his counselor said "she has done this before" .  My chart VV scheduled with PCP for 07/27/23, earliest available. Please advise . Patient does not want to go to ED bc they  will not give medications and he does not want to go to the streets for any medication bc of withdrawals and he has been doing well before now. Please advise.  CAL notified of patient refusal for ED.      Copied from CRM [id number]. Topic: Clinical - Red Word Triage >> Jul 25, 2023 11:14 AM Shane Wilcox wrote: [facility] that prompted transfer to Nurse Triage: Patient stated he is having withdrawals he's been 12 day with out his percocet and he is not wanting to go back to the streets for it. Reason for Disposition  Patient sounds very sick or weak to the triager  Answer Assessment - Initial Assessment Questions 1. CONCERN: "Did anything happen that prompted you to call today?"      Out of medication percoet and xanax  2. ANXIETY SYMPTOMS: "Can you describe how you (your loved one;  patient) have been feeling?" (e.g., tense, restless, panicky, anxious, keyed up, overwhelmed, sense of impending doom).      Anxious, nausea, sweating  3. ONSET: "How long have you been feeling this way?" (e.g., hours, days, weeks)     Out of medication x 12 days  4. SEVERITY: "How would you rate the level of anxiety?" (e.g., 0 - 10; or mild, moderate, severe).     high 5. FUNCTIONAL IMPAIRMENT: "How have these feelings affected your ability to do daily activities?" "Have you had more difficulty than usual doing your normal daily activities?" (e.g., getting better, same, worse; self-care, school, work, interactions)     Difficulty functioning  6. HISTORY: "Have you felt this way before?" "Have you ever been diagnosed with an anxiety problem in the past?" (e.g., generalized anxiety disorder, panic attacks, PTSD). If Yes, ask: "How was this problem treated?" (e.g., medicines, counseling, etc.)     na 7. RISK OF HARM - SUICIDAL IDEATION: "Do you ever have thoughts of hurting or killing yourself?" If Yes, ask:  "Do you have these feelings now?" "Do you have a plan on how you would do this?"     na 8. TREATMENT:  "What has been done so far to treat this anxiety?" (e.g., medicines, relaxation strategies). "What has helped?"     na 9. TREATMENT - THERAPIST: "Do you have a counselor or therapist? Name?"     Has contacted counselor and counselor can not get in touch with Shane Wilcox from  pain clinic to reorder medications.  10. POTENTIAL TRIGGERS: "Do you drink caffeinated beverages (e.g., coffee, colas, teas), and how much daily?" "Do you drink alcohol or use any drugs?" "Have you started any new medicines recently?"       Na  11. PATIENT SUPPORT: "Who is with you now?" "Who do you live with?" "Do you have family or friends who you can talk to?"        Na  12. OTHER SYMPTOMS: "Do you have any other symptoms?" (e.g., feeling depressed, trouble concentrating, trouble sleeping, trouble breathing, palpitations  or fast heartbeat, chest pain, sweating, nausea, or diarrhea)       Not sleeping more than 2 hours at a time. Sweating nausea, anxious 13. PREGNANCY: "Is there any chance you are pregnant?" "When was your last menstrual period?"       na  Protocols used: Anxiety and Panic Attack-A-AH

## 2023-07-25 NOTE — Telephone Encounter (Signed)
 Called pt. No answer and Unable to leave voicemail.   Sent mychart message relaying information. Pt was active on mychart 07/25/23

## 2023-07-25 NOTE — Telephone Encounter (Signed)
 Copied from CRM [id number]. Topic: Clinical - Medication Question >> Jul 25, 2023  1:46 PM Deaijah H wrote: Reason for CRM: Patient called in asking to speak with Dr. Emmaline Haring nurse due to his Pain management closing, stated he's a few days in without his medication and is struggling. 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 oxyCODONE -acetaminophen  (PERCOCET) 10-325 MG tablet and Xanax

## 2023-07-25 NOTE — Telephone Encounter (Signed)
 Pt called stating attempting to follow-up on message from earlier regarding getting his percocet.  Nurse informed pt that PCP office attempted to call him and left him a message and also sent him a my chart to include information from PCP. Pt stated voicemail was left on finance voicemail and not his therefore he did not get it.  Informed pt PCP stated "   e can advise the patient that he is followed by a pain clinic and I will not provide opioid medications but will provide withdrawal medications if he still needs them ."  Pt stated he does not want any withdrawal medication and his counselor told him he should not take any withdrawal medication as well.  Pt states he has been on this medication for over 4 years and does not want to go back to the streets therefore he needs his medication.  Attempted to explain pt that decision is not up to me it is up to the PCP: told pt I would transfer to office to possibly speak to someone over there.  Asked patient to hold while I contact CAL pt stated he was not going to keep holding at that PCP just should call him back on his number  [phone] and pt hung up.  Called CAL spoke with Sherline Distel: she attempted to get CMA on line without success - stated PCP gone for the day and pt will have to wait for call back tomorrow.

## 2023-07-25 NOTE — Telephone Encounter (Signed)
 Returned pt call.  Pt states that the pain clinic has closed and he just needs a couple days worth of medication for pain.  Pt also mentions he needs help with the pain not because he is an addict. "Look at my story. You'll see its for my pain."  Pt was hard to understand during call. Loud and erratic.  Pt states " I take the meds for pain and to not think about my sons head being blown on the walls"  Advised to pt that the message sent to nurse indicated that pt could not reach pain clinic and not that the pain clinic has closed down.  Pt hung the phone up while attempting to explain the reason for not being able to get Percocet and Xanax prescribed.   Reached out to [facility] and was unable to reach an agent. Call went to VM.

## 2023-07-25 NOTE — Telephone Encounter (Signed)
 Patient called the office requesting to speak with provider [facility] nurse. Patient informed that nurse was not available as it is after 5:00 pm. Patient requested nurse to contact [facility] and ask to speak with Wendy(NP) at [phone]. Patient is currently at the ED.

## 2023-07-25 NOTE — Telephone Encounter (Signed)
 We can advise the patient that he is followed by a pain clinic and I will not provide opioid medications but will provide withdrawal medications if he still needs them

## 2023-07-26 ENCOUNTER — Other Ambulatory Visit: Payer: Self-pay | Admitting: Nurse Practitioner

## 2023-07-26 ENCOUNTER — Encounter: Payer: Self-pay | Admitting: Nurse Practitioner

## 2023-07-26 ENCOUNTER — Telehealth: Payer: Self-pay | Admitting: Nurse Practitioner

## 2023-07-26 NOTE — Telephone Encounter (Signed)
 Most times pain clinics have controlled substance agreements. If I prescribe then that breaks the agreement. He needs to get his medication from his pain clinic. My message stands as before

## 2023-07-26 NOTE — Telephone Encounter (Signed)
 Patient called office upset regarding dismissal letter he received. Patient wanted to kno why  Patient stated PCP only wanted to give him withdrawal meds and that he was going to "cuss him out" the next time he saw him  Patient used vulgar language on the phone and was advised to stop.  Patient abruptly hung up the phone

## 2023-07-26 NOTE — Telephone Encounter (Signed)
 Called and spoke with Shane Wilcox at [facility] pain management. Patient was seen on 06/21/2023 and had a follow up appointment on 07/19/2023 that he did not make.  I asked if patient was still a patient and he is. He has not been dismissed from the practice

## 2023-07-26 NOTE — Telephone Encounter (Signed)
 This is the second message I have gotten about patient being rude and disrespectful towards staff. I do not tolerate that kind of behavior and move to have patient dismissed from my services

## 2023-07-27 ENCOUNTER — Telehealth (INDEPENDENT_AMBULATORY_CARE_PROVIDER_SITE_OTHER): Admitting: Nurse Practitioner

## 2023-07-27 VITALS — Ht 74.0 in

## 2023-07-27 DIAGNOSIS — F1193 Opioid use, unspecified with withdrawal: Secondary | ICD-10-CM | POA: Diagnosis not present

## 2023-07-27 NOTE — Telephone Encounter (Signed)
 Pt seen for virtual visit today

## 2023-07-27 NOTE — Assessment & Plan Note (Signed)
 Patient is at least 8 to 12 days past last medication use.  He should be out of the acute withdrawal period.  In previous messages patient states he did not want withdrawal medication as was advised against by his counselor.  Encouraged patient to contact [facility].  I did speak with him yesterday and he did have an appointment on 07/19/2023 but he was not able to make.  Did inform patient that controlled substances cannot be called across state lines.  Inform patient I would not refill any of the medications

## 2023-07-27 NOTE — Progress Notes (Signed)
 Ph: [phone] Fax: [phone]   Patient ID: Shane Wilcox, male    DOB: 1976-02-01, 48 y.o.   MRN: [id number]  Virtual visit completed through MyChart, a video enabled telemedicine application. Due to national recommendations of social distancing due to COVID-19, a virtual visit is felt to be most appropriate for this patient at this time. Reviewed limitations, risks, security and privacy concerns of performing a virtual visit and the availability of in person appointments. I also reviewed that there may be a patient responsible charge related to this service. The patient agreed to proceed.   Patient location: Car in [place]  Provider location: Adult nurse at [facility], office Persons participating in this virtual visit: patient, provider   If any vitals were documented, they were collected by patient at home unless specified below.    Ht 6\' 2"  (1.88 m)   BMI 26.71 kg/m    CC: Medication concerns Subjective:   HPI: Shane Wilcox is a 48 y.o. male presenting on 07/27/2023 for medication mangement    Patient reached out and was triaged on 07/25/2023 stating that he felt like he was having withdrawals from his medications. He had been out of his percocets and xanax for 12 days. I asked them to call the patient and tell him I would not write his controlled substances as he is seeing a pain clinic and likely under a controlled substance agreement.    States that he has been out of the medication and has been out of his medications since the 23rd. States that he has been irritable.  Of note patient was in his vehicle eating the entire video visit.  States that his mom is having appendix surgery and had a lot going on  I did call the pain clinic on 07/26/2023 and he did have an appointmetn scheduled for 07/19/2023. He has tried to call the pain clinic with no result.  Towards end of the visit patient did say that they have the provider [facility] personal cell phone  number.  I did ask if they have reached out state that it did but it took a long time for the provider to get back in touch with them.  Patient states that he has been to the emergency department while in [place]  and they do not get on any medication.  Patient states we have a choice of physicians associate was going to call my office to speak with me.  I never got notification a phone call.  Per patient report she offered him a short prescription of medication but would not do it until she talk to me to see what today's visit outcome was    Relevant past medical, surgical, family and social history reviewed and updated as indicated. Interim medical history since our last visit reviewed. Allergies and medications reviewed and updated. Outpatient Medications Prior to Visit  Medication Sig Dispense Refill   ALPRAZolam (XANAX) 0.25 MG tablet Take 0.25 mg by mouth 2 (two) times daily as needed.     glipiZIDE  (GLUCOTROL  XL) 5 MG 24 hr tablet Take 1 tablet (5 mg total) by mouth daily with breakfast. 90 tablet 1   lisinopril  (ZESTRIL ) 2.5 MG tablet Take 1 tablet (2.5 mg total) by mouth daily. 90 tablet 2   methocarbamol  (ROBAXIN ) 500 MG tablet Oral for 10 Days     oxyCODONE -acetaminophen  (PERCOCET) 10-325 MG tablet Take by mouth.     ranitidine (ZANTAC) 150 MG tablet Take 1 tablet twice a day by oral route.  rosuvastatin  (CRESTOR ) 5 MG tablet Take 1 tablet (5 mg total) by mouth daily. 90 tablet 2   SitaGLIPtin-MetFORMIN HCl (JANUMET  XR) 50-1000 MG TB24 Take 2 tablets by mouth daily. 180 tablet 1   tadalafil  (CIALIS ) 10 MG tablet Take 1 tablet (10 mg total) by mouth every other day as needed for erectile dysfunction. 10 tablet 1   Ergocalciferol  50 MCG (2000 UT) CAPS Take by mouth. (Patient not taking: Reported on 07/27/2023)     Influenza-SARS Antigen Test (STATUS COVID-19/FLU A&B) KIT TEST AS DIRECTED TODAY In Vitro for 1 Days     No facility-administered medications prior to visit.     Per HPI  unless specifically indicated in ROS section below Review of Systems  Respiratory:  Negative for shortness of breath.   Cardiovascular:  Negative for chest pain.   Objective:  Ht 6\' 2"  (1.88 m)   BMI 26.71 kg/m   Wt Readings from Last 3 Encounters:  02/16/23 208 lb (94.3 kg)  11/16/22 212 lb (96.2 kg)  11/08/21 220 lb 2 oz (99.8 kg)       Physical exam: Gen: alert, NAD, not ill appearing Pulm: speaks in complete sentences without increased work of breathing Psych: normal mood, normal thought content      Results for orders placed or performed in visit on 02/16/23  POCT glycosylated hemoglobin (Hb A1C)   Collection Time: 02/16/23  8:56 AM  Result Value Ref Range   Hemoglobin A1C 10.8 (A) 4.0 - 5.6 %   HbA1c POC (<> result, manual entry)     HbA1c, POC (prediabetic range)     HbA1c, POC (controlled diabetic range)     Assessment & Plan:      I discussed the assessment and treatment plan with the patient. The patient was provided an opportunity to ask questions and all were answered. The patient agreed with the plan and demonstrated an understanding of the instructions. The patient was advised to call back or seek an in-person evaluation if the symptoms worsen or if the condition fails to improve as anticipated.  Follow up plan: No follow-ups on file.  Margarie Shay, NP
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
